# Patient Record
Sex: Female | Born: 1980 | Race: Black or African American | Hispanic: No | Marital: Single | Smoking: Never smoker
Health system: Southern US, Community
[De-identification: ages and names within clinical notes are randomized; demographics above are authoritative.]

## PROBLEM LIST (undated history)

## (undated) HISTORY — PX: TUBAL LIGATION: SHX77

---

## 2018-12-15 ENCOUNTER — Encounter (HOSPITAL_BASED_OUTPATIENT_CLINIC_OR_DEPARTMENT_OTHER): Payer: Self-pay | Admitting: *Deleted

## 2018-12-15 ENCOUNTER — Emergency Department (HOSPITAL_BASED_OUTPATIENT_CLINIC_OR_DEPARTMENT_OTHER): Payer: BC Managed Care – PPO

## 2018-12-15 ENCOUNTER — Emergency Department (HOSPITAL_BASED_OUTPATIENT_CLINIC_OR_DEPARTMENT_OTHER)
Admission: EM | Admit: 2018-12-15 | Discharge: 2018-12-15 | Disposition: A | Discharge status: Home / Self Care | Payer: BC Managed Care – PPO | Attending: Emergency Medicine | Admitting: Emergency Medicine

## 2018-12-15 ENCOUNTER — Other Ambulatory Visit: Payer: Self-pay

## 2018-12-15 DIAGNOSIS — R062 Wheezing: Secondary | ICD-10-CM | POA: Diagnosis not present

## 2018-12-15 DIAGNOSIS — R0602 Shortness of breath: Secondary | ICD-10-CM | POA: Diagnosis present

## 2018-12-15 DIAGNOSIS — R0600 Dyspnea, unspecified: Secondary | ICD-10-CM | POA: Diagnosis not present

## 2018-12-15 LAB — CBC WITH DIFFERENTIAL/PLATELET
Abs Immature Granulocytes: 0.02 10*3/uL (ref 0.00–0.07)
Basophils Absolute: 0 10*3/uL (ref 0.0–0.1)
Basophils Relative: 0 %
Eosinophils Absolute: 0 10*3/uL (ref 0.0–0.5)
Eosinophils Relative: 1 %
HCT: 44.1 % (ref 36.0–46.0)
Hemoglobin: 14.5 g/dL (ref 12.0–15.0)
Immature Granulocytes: 0 %
Lymphocytes Relative: 41 %
Lymphs Abs: 2 10*3/uL (ref 0.7–4.0)
MCH: 30 pg (ref 26.0–34.0)
MCHC: 32.9 g/dL (ref 30.0–36.0)
MCV: 91.1 fL (ref 80.0–100.0)
Monocytes Absolute: 0.3 10*3/uL (ref 0.1–1.0)
Monocytes Relative: 7 %
Neutro Abs: 2.5 10*3/uL (ref 1.7–7.7)
Neutrophils Relative %: 51 %
Platelets: 187 10*3/uL (ref 150–400)
RBC: 4.84 MIL/uL (ref 3.87–5.11)
RDW: 13 % (ref 11.5–15.5)
WBC: 4.8 10*3/uL (ref 4.0–10.5)
nRBC: 0 % (ref 0.0–0.2)

## 2018-12-15 LAB — BASIC METABOLIC PANEL
Anion gap: 7 (ref 5–15)
BUN: 14 mg/dL (ref 6–20)
CO2: 23 mmol/L (ref 22–32)
Calcium: 9.3 mg/dL (ref 8.9–10.3)
Chloride: 108 mmol/L (ref 98–111)
Creatinine, Ser: 0.83 mg/dL (ref 0.44–1.00)
GFR calc Af Amer: >60 mL/min (ref >60)
GFR calc non Af Amer: >60 mL/min (ref >60)
Glucose, Bld: 74 mg/dL (ref 70–99)
Potassium: 3.5 mmol/L (ref 3.5–5.1)
Sodium: 138 mmol/L (ref 135–145)

## 2018-12-15 MED ORDER — ALBUTEROL SULFATE HFA 108 (90 BASE) MCG/ACT IN AERS
2.0000 | INHALATION_SPRAY | Freq: Once | RESPIRATORY_TRACT | Status: AC
  Administered 2018-12-15: 2 via RESPIRATORY_TRACT
  Filled 2018-12-15: qty 6.7

## 2018-12-15 MED ORDER — IOHEXOL 300 MG/ML  SOLN
75.0000 mL | Freq: Once | INTRAMUSCULAR | Status: AC | PRN
  Administered 2018-12-15: 75 mL via INTRAVENOUS

## 2018-12-15 NOTE — ED Provider Notes (Signed)
[facility] EMERGENCY DEPARTMENT Provider Note   CSN: [id number] Arrival date & time: 12/15/18  1818     History   Chief Complaint Chief Complaint  Patient presents with  . Shortness of Breath    HPI Lisa Giles is a 38 y.o. female.     HPI   Pt is a 38 y/o female who presents to the ED today for eval of shortness of breath. States that for the last several years. States it seems like it has become worse over the last few months.  States that today Lisa Giles felt like Lisa Giles was hyperventilating when walking up the stairs at work.  Lisa Giles has sxs at rest and with exertion, but seem to be worse with exertion. Denies chest pain. States that it feels like Lisa Giles is "breathing through a broken straw".   Denies leg pain/swelling, hemoptysis, recent surgery/trauma, recent long travel, hormone use, personal hx of cancer, or hx of DVT/PE. Denies bloody stool or heavy vaginal bleeding.   Lisa Giles states Lisa Giles has a pcp and has f/u with them about it. Upon review of her chart it appears Lisa Giles was referred to pulmonology but Lisa Giles states that Lisa Giles did not follow-up with them. Lisa Giles states Lisa Giles has had outpatient PFTs and is not aware that there was any abnormal finding.  States that when Lisa Giles drinks tumeric tea her sxs improve. Lisa Giles reports feeling anxious when this happens.   Denies h/o tobacco or drug use. Denies early family hx of MI. Denies history of hypertension, hyperlipidemia, diabetes.  History reviewed. No pertinent past medical history.  There are no active problems to display for this patient.  Past Surgical History:  Procedure Laterality Date  . TUBAL LIGATION       OB History   No obstetric history on file.      Home Medications    Prior to Admission medications   Not on File    Family History History reviewed. No pertinent family history.  Social History Social History   Tobacco Use  . Smoking status: Never Smoker  . Smokeless tobacco: Never Used  Substance Use  Topics  . Alcohol use: Not Currently  . Drug use: Not Currently     Allergies   Patient has no known allergies.   Review of Systems Review of Systems  Constitutional: Negative for chills and fever.  HENT: Negative for ear pain and sore throat.        Neck swelling  Eyes: Negative for pain and visual disturbance.  Respiratory: Positive for shortness of breath. Negative for cough.   Cardiovascular: Negative for chest pain and leg swelling.  Gastrointestinal: Negative for abdominal pain, constipation, diarrhea, nausea and vomiting.  Genitourinary: Negative for dysuria and hematuria.  Musculoskeletal: Negative for back pain.  Skin: Negative for rash.  Neurological: Negative for headaches.  All other systems reviewed and are negative.    Physical Exam Updated Vital Signs BP 140/78 (BP Location: Left Arm)   Pulse 68   Temp 98.4 F (36.9 C) (Oral)   Resp 20   Ht 5\' 7"  (1.702 m)   Wt 81.6 kg   SpO2 97%   BMI 28.19 kg/m   Physical Exam Vitals signs and nursing note reviewed.  Constitutional:      General: Lisa Giles is not in acute distress.    Appearance: Lisa Giles is well-developed.  HENT:     Head: Normocephalic and atraumatic.  Eyes:     Conjunctiva/sclera: Conjunctivae normal.  Neck:     Musculoskeletal: Neck  supple.     Comments: Mild area of increased tissue density to the right side of the next which seems slightly lateral to the thyroid gland. This area is soft and nontender.  Cardiovascular:     Rate and Rhythm: Normal rate and regular rhythm.     Heart sounds: Normal heart sounds. No murmur.  Pulmonary:     Effort: Pulmonary effort is normal. No respiratory distress.     Breath sounds: Examination of the right-lower field reveals wheezing. Wheezing present. No decreased breath sounds, rhonchi or rales.  Abdominal:     General: Bowel sounds are normal.     Palpations: Abdomen is soft.     Tenderness: There is no abdominal tenderness.  Musculoskeletal:     Right  lower leg: Lisa Giles exhibits no tenderness. No edema.     Left lower leg: Lisa Giles exhibits no tenderness. No edema.  Skin:    General: Skin is warm and dry.  Neurological:     Mental Status: Lisa Giles is alert.      ED Treatments / Results  Labs (all labs ordered are listed, but only abnormal results are displayed) Labs Reviewed  CBC WITH DIFFERENTIAL/PLATELET  BASIC METABOLIC PANEL    EKG None  Radiology Ct Soft Tissue Neck W Contrast  Result Date: 12/15/2018 CLINICAL DATA:  Right neck swelling EXAM: CT NECK WITH CONTRAST TECHNIQUE: Multidetector CT imaging of the neck was performed using the standard protocol following the bolus administration of intravenous contrast. CONTRAST:  5mL OMNIPAQUE IOHEXOL 300 MG/ML  SOLN COMPARISON:  None. FINDINGS: Pharynx and larynx: Normal. No mass or swelling. Salivary glands: No inflammation, mass, or stone. Thyroid: Negative Lymph nodes: No enlarged lymph nodes in the neck. Vascular: Normal vascular enhancement. Limited intracranial: Negative Visualized orbits: Negative Mastoids and visualized paranasal sinuses: Paranasal sinuses clear. Mastoid sinus clear. Skeleton: Negative Upper chest: Negative Other: None IMPRESSION: Negative CT neck. Electronically Signed   By: Marlan Palau M.D.   On: 12/15/2018 20:34   Dg Chest Portable 1 View  Result Date: 12/15/2018 CLINICAL DATA:  SOB x 3 YEARS; worsening over the past few months; today felt like Lisa Giles was hyperventilating; EXAM: PORTABLE CHEST 1 VIEW COMPARISON:  06/19/2014 FINDINGS: Cardiac silhouette is normal in size. No mediastinal or hilar masses or evidence of adenopathy. Clear lungs.  No pleural effusion or pneumothorax. Skeletal structures are grossly intact. IMPRESSION: No active disease. Electronically Signed   By: Amie Portland M.D.   On: 12/15/2018 19:34    Procedures Procedures (including critical care time)  Medications Ordered in ED Medications  albuterol (VENTOLIN HFA) 108 (90 Base) MCG/ACT  inhaler 2 puff (2 puffs Inhalation Given 12/15/18 1933)  iohexol (OMNIPAQUE) 300 MG/ML solution 75 mL (75 mLs Intravenous Contrast Given 12/15/18 2009)     Initial Impression / Assessment and Plan / ED Course  I have reviewed the triage vital signs and the nursing notes.  Pertinent labs & imaging results that were available during my care of the patient were reviewed by me and considered in my medical decision making (see chart for details).   Final Clinical Impressions(s) / ED Diagnoses   Final diagnoses:  Dyspnea, unspecified type   38 year old female who presents the emergency department today complaining of shortness of breath that seems to be a chronic problem but Lisa Giles thinks is worsened over the last 2 months.  It is not associated with any other significant symptoms.  Lisa Giles is often so concerned because Lisa Giles feels like Lisa Giles has some swelling to  the right side of her neck which is also been chronic.  No difficulty swallowing.  Tolerating secretions.  Lisa Giles is mildly hypertensive but otherwise her vital signs are reassuring.  Lisa Giles has some very slight wheezing on her lung exam but otherwise no abnormalities.  Heart with regular rate and rhythm.  No peripheral edema, calf tenderness or erythema.  CBC without anemia.  BMP with normal electrolytes and kidney function.  Chest x-ray is negative.  CT soft tissue neck did not show any abnormalities with the thyroid or elsewhere in the neck.  Patient was given albuterol.  Lisa Giles states it made her feel jittery but did not change her symptoms although on reassessment Lisa Giles is now stating that Lisa Giles does not feel short of breath anymore.  Lisa Giles has continued to maintain sats at 97 and above during her stay.  Lisa Giles was ambulated about the ED and had sats at 98% on room air.  I offered Covid testing and Lisa Giles declined.  I do have lower suspicion for this given that this seems to be much more of a chronic problem.  Advised that I am unsure of the etiology of her symptoms  but I have low suspicion for emergent pathology at this time and feel that Lisa Giles can follow-up as an outpatient.  I will give referral to pulmonology.  Have also advised her to follow-up with PCP as Lisa Giles may need ultrasound of the thyroid.  Advised on specific return precautions.  Lisa Giles voiced understanding of the plan and reasons to return.  All questions answered.  Patient stable for discharge.  ED Discharge Orders    None       Rayne DuCouture, Alva Broxson S, PA-C 12/15/18 2314    Gwyneth SproutPlunkett, Whitney, MD 12/15/18 [phone]

## 2018-12-15 NOTE — ED Triage Notes (Signed)
Pt c/o SOB x 3 years increased SOB x 2 months , denies asthma

## 2018-12-15 NOTE — Progress Notes (Signed)
RN gave albuterol treatment °

## 2018-12-15 NOTE — ED Notes (Signed)
Pulse ox at 98% while ambulating through department. No SOB noted, mild exp wheezes

## 2018-12-15 NOTE — Discharge Instructions (Signed)
Please take two puffs of the albuterol inhaler every 4-6 hours as needed for shortness of breath.   You were given information of follow-up with a pulmonologist.  Please call the office to schedule an appointment.  The CT scan of your neck did not show any evidence of abnormality in your thyroid today however you may need to follow-up with your primary care doctor to get an ultrasound of your thyroid.  Please make an appointment to follow-up with them within the next 1 to 2 weeks.  Monitor your symptoms closely, if your symptoms worsen or change please return to the emergency department for reevaluation.

## 2019-06-23 ENCOUNTER — Encounter (HOSPITAL_COMMUNITY): Payer: Self-pay

## 2019-06-23 ENCOUNTER — Emergency Department (HOSPITAL_COMMUNITY): Payer: BC Managed Care – PPO

## 2019-06-23 ENCOUNTER — Other Ambulatory Visit: Payer: Self-pay

## 2019-06-23 ENCOUNTER — Emergency Department (HOSPITAL_COMMUNITY)
Admission: EM | Admit: 2019-06-23 | Discharge: 2019-06-23 | Disposition: A | Discharge status: Home / Self Care | Payer: BC Managed Care – PPO | Attending: Emergency Medicine | Admitting: Emergency Medicine

## 2019-06-23 DIAGNOSIS — R140 Abdominal distension (gaseous): Secondary | ICD-10-CM | POA: Diagnosis not present

## 2019-06-23 DIAGNOSIS — K5900 Constipation, unspecified: Secondary | ICD-10-CM | POA: Diagnosis not present

## 2019-06-23 DIAGNOSIS — R109 Unspecified abdominal pain: Secondary | ICD-10-CM | POA: Diagnosis present

## 2019-06-23 LAB — COMPREHENSIVE METABOLIC PANEL
ALT: 16 U/L (ref 0–44)
AST: 20 U/L (ref 15–41)
Albumin: 4.2 g/dL (ref 3.5–5.0)
Alkaline Phosphatase: 51 U/L (ref 38–126)
Anion gap: 6 (ref 5–15)
BUN: 8 mg/dL (ref 6–20)
CO2: 25 mmol/L (ref 22–32)
Calcium: 8.8 mg/dL — ABNORMAL LOW (ref 8.9–10.3)
Chloride: 108 mmol/L (ref 98–111)
Creatinine, Ser: 0.81 mg/dL (ref 0.44–1.00)
GFR calc Af Amer: >60 mL/min (ref >60)
GFR calc non Af Amer: >60 mL/min (ref >60)
Glucose, Bld: 84 mg/dL (ref 70–99)
Potassium: 4.5 mmol/L (ref 3.5–5.1)
Sodium: 139 mmol/L (ref 135–145)
Total Bilirubin: 0.9 mg/dL (ref 0.3–1.2)
Total Protein: 7.2 g/dL (ref 6.5–8.1)

## 2019-06-23 LAB — CBC
HCT: 41.3 % (ref 36.0–46.0)
Hemoglobin: 13.4 g/dL (ref 12.0–15.0)
MCH: 29.8 pg (ref 26.0–34.0)
MCHC: 32.4 g/dL (ref 30.0–36.0)
MCV: 92 fL (ref 80.0–100.0)
Platelets: 197 10*3/uL (ref 150–400)
RBC: 4.49 MIL/uL (ref 3.87–5.11)
RDW: 12.9 % (ref 11.5–15.5)
WBC: 5.1 10*3/uL (ref 4.0–10.5)
nRBC: 0 % (ref 0.0–0.2)

## 2019-06-23 LAB — URINALYSIS, ROUTINE W REFLEX MICROSCOPIC
Bilirubin Urine: NEGATIVE
Glucose, UA: NEGATIVE mg/dL
Hgb urine dipstick: NEGATIVE
Ketones, ur: NEGATIVE mg/dL
Leukocytes,Ua: NEGATIVE
Nitrite: NEGATIVE
Protein, ur: NEGATIVE mg/dL
Specific Gravity, Urine: 1.016 (ref 1.005–1.030)
pH: 8 (ref 5.0–8.0)

## 2019-06-23 LAB — I-STAT BETA HCG BLOOD, ED (MC, WL, AP ONLY): I-stat hCG, quantitative: <5 m[IU]/mL (ref <5)

## 2019-06-23 LAB — LIPASE, BLOOD: Lipase: 23 U/L (ref 11–51)

## 2019-06-23 MED ORDER — PSYLLIUM 58.6 % PO PACK: 1.0000 | PACK | Freq: Two times a day (BID) | ORAL | 0 refills | Status: AC

## 2019-06-23 MED ORDER — SODIUM CHLORIDE (PF) 0.9 % IJ SOLN
INTRAMUSCULAR | Status: AC
  Filled 2019-06-23: qty 50

## 2019-06-23 MED ORDER — DOCUSATE SODIUM 100 MG PO CAPS: 100.0000 mg | ORAL_CAPSULE | Freq: Two times a day (BID) | ORAL | 0 refills | Status: AC

## 2019-06-23 MED ORDER — SODIUM CHLORIDE 0.9% FLUSH
3.0000 mL | Freq: Once | INTRAVENOUS | Status: AC
  Administered 2019-06-23: 3 mL via INTRAVENOUS

## 2019-06-23 MED ORDER — IOHEXOL 300 MG/ML  SOLN
100.0000 mL | Freq: Once | INTRAMUSCULAR | Status: AC | PRN
  Administered 2019-06-23: 100 mL via INTRAVENOUS

## 2019-06-23 NOTE — ED Triage Notes (Signed)
Pt presents with c/o left lower abdominal pain. Pt was seen at her PCP yesterday and told to increase her fiber intake. Pt reports that she believes she is constipated and has not had a bowel movement without the use of an enema in 2 weeks. Pt reports a feeling of fullness each time she eats.

## 2019-06-23 NOTE — ED Provider Notes (Signed)
[facility]-EMERGENCY DEPT Provider Note   CSN: [id number] Arrival date & time: 06/23/19  1110     History Chief Complaint  Patient presents with  . Abdominal Pain    Lisa Giles is a 39 y.o. female.  Presents today for evaluation of acute onset, persistent constipation for 2 weeks.  She reports she has a history of chronic constipation but has not been able to have a bowel movement without administering an enema over the last 2 weeks.  She does report improvement in her symptoms after administration of enema.  Reports early satiety and feeling bloated to her abdomen.  She also feels a pulling sensation with meals and certain movements as well.  Denies urinary symptoms, fevers, chills.  She has also been taking magnesium citrate, MiraLAX, and mineral oil with little relief.  She went to her PCP yesterday who recommended increasing fiber intake.  Reports intermittent lower abdominal pains which she describes mostly as a pressure.  No vomiting.  Reports she is never had symptoms like this before.  Prior to this she reports she would have a bowel movement approximately 3 times a week may be every 3 to 4 days.  The history is provided by the patient.       History reviewed. No pertinent past medical history.  There are no problems to display for this patient.   Past Surgical History:  Procedure Laterality Date  . TUBAL LIGATION       OB History   No obstetric history on file.     History reviewed. No pertinent family history.  Social History   Tobacco Use  . Smoking status: Never Smoker  . Smokeless tobacco: Never Used  Substance Use Topics  . Alcohol use: Not Currently  . Drug use: Not Currently    Home Medications Prior to Admission medications   Medication Sig Start Date End Date Taking? Authorizing Provider  polyethylene glycol (MIRALAX / GLYCOLAX) 17 g packet Take 17 g by mouth once.   Yes [provider]  docusate sodium (COLACE)  100 MG capsule Take 1 capsule (100 mg total) by mouth every 12 (twelve) hours. 06/23/19   Millee Denise A, PA-C  psyllium (METAMUCIL) 58.6 % packet Take 1 packet by mouth 2 (two) times daily. 06/23/19   Michela Pitcher A, PA-C    Allergies    Patient has no known allergies.  Review of Systems   Review of Systems  Constitutional: Negative for chills and fever.  Respiratory: Negative for shortness of breath.   Cardiovascular: Negative for chest pain.  Gastrointestinal: Positive for abdominal pain and constipation. Negative for diarrhea and vomiting.  Genitourinary: Negative for dysuria, frequency, hematuria and urgency.  All other systems reviewed and are negative.   Physical Exam Updated Vital Signs BP 131/88 (BP Location: Right Arm)   Pulse 75   Temp 98.8 F (37.1 C) (Oral)   Resp 16   LMP 05/02/2019 (Approximate)   SpO2 100%   Physical Exam Vitals and nursing note reviewed.  Constitutional:      General: She is not in acute distress.    Appearance: She is well-developed.  HENT:     Head: Normocephalic and atraumatic.  Eyes:     General:        Right eye: No discharge.        Left eye: No discharge.     Conjunctiva/sclera: Conjunctivae normal.  Neck:     Vascular: No JVD.     Trachea: No tracheal  deviation.  Cardiovascular:     Rate and Rhythm: Normal rate and regular rhythm.  Pulmonary:     Effort: Pulmonary effort is normal.     Breath sounds: Normal breath sounds.  Abdominal:     General: Bowel sounds are normal. There is no distension.     Palpations: Abdomen is soft.     Tenderness: There is abdominal tenderness in the left upper quadrant and left lower quadrant. There is no right CVA tenderness, left CVA tenderness, guarding or rebound.  Skin:    General: Skin is warm and dry.     Findings: No erythema.  Neurological:     Mental Status: She is alert.  Psychiatric:        Behavior: Behavior normal.     ED Results / Procedures / Treatments   Labs (all labs  ordered are listed, but only abnormal results are displayed) Labs Reviewed  COMPREHENSIVE METABOLIC PANEL - Abnormal; Notable for the following components:      Result Value   Calcium 8.8 (*)    All other components within normal limits  LIPASE, BLOOD  CBC  URINALYSIS, ROUTINE W REFLEX MICROSCOPIC  I-STAT BETA HCG BLOOD, ED ([facility], [facility], [facility] ONLY)    EKG None  Radiology CT ABDOMEN PELVIS W CONTRAST  Result Date: 06/23/2019 CLINICAL DATA:  Pt presents with c/o left lower abdominal pain. Pt was seen at her PCP yesterday and told to increase her fiber intake. Pt reports that she believes she is constipated and has not had a bowel movement without the use of an enema Diverticulitis suspected EXAM: CT ABDOMEN AND PELVIS WITH CONTRAST TECHNIQUE: Multidetector CT imaging of the abdomen and pelvis was performed using the standard protocol following bolus administration of intravenous contrast. CONTRAST:  OMNIPAQUE IOHEXOL 300 MG/ML  SOLN COMPARISON:  None FINDINGS: Lower chest: Lung bases are clear. Hepatobiliary: No focal hepatic lesion. No biliary duct dilatation. Gallbladder is normal. Common bile duct is normal. Pancreas: Pancreas is normal. No ductal dilatation. No pancreatic inflammation. Spleen: Normal spleen Adrenals/urinary tract: Adrenal glands and kidneys are normal. The ureters and bladder normal. Stomach/Bowel: Stomach, small bowel, appendix, and cecum are normal. Ascending, transverse and descending colon normal. Mild to moderate volume stool in the RIGHT colon. The LEFT colon is collapsed. Rectosigmoid colon is collapsed. No bowel inflammation or obstruction. Vascular/Lymphatic: Abdominal aorta is normal caliber. No periportal or retroperitoneal adenopathy. No pelvic adenopathy. Reproductive: Uterus and ovaries are normal. Enhancing follicle within the LEFT ovary. Small amount free fluid the pelvis is favored physiologic. Other: No pelvic infection, abscess or inflammation. Musculoskeletal:  No aggressive osseous lesion. IMPRESSION: 1. No bowel obstruction or inflammation. Mild to moderate volume stool in the RIGHT colon is favored within normal limits. LEFT colon collapsed. 2. Small amount free fluid the pelvis is favored physiologic. Electronically Signed   By: Genevive Bi M.D.   On: 06/23/2019 14:10    Procedures Procedures (including critical care time)  Medications Ordered in ED Medications  sodium chloride (PF) 0.9 % injection (has no administration in time range)  sodium chloride flush (NS) 0.9 % injection 3 mL (3 mLs Intravenous Given 06/23/19 1348)  iohexol (OMNIPAQUE) 300 MG/ML solution 100 mL (100 mLs Intravenous Contrast Given 06/23/19 1355)    ED Course  I have reviewed the triage vital signs and the nursing notes.  Pertinent labs & imaging results that were available during my care of the patient were reviewed by me and considered in my medical decision making (see  chart for details).    MDM Rules/Calculators/A&P                      Patient presenting for evaluation of constipation, abdominal pain.  She is afebrile, vital signs are stable.  She is nontoxic in appearance.  No rebound or guarding noted on examination of the abdomen.  She has longstanding issues with constipation per her report but has never been seen by gastroenterology.  Clinically she is well-appearing and resting comfortably on initial assessment.  Lab work reviewed and interpreted by myself shows no leukocytosis, no anemia, no metabolic derangements, no renal insufficiency.  UA does not suggest UTI or nephrolithiasis.  CT scan shows no evidence of bowel obstruction or inflammation.  It does show mild to moderate volume stool in the right colon with left colon collapse consistent with her history of recent enemas.  No evidence of acute surgical abdominal pathology today.  On reevaluation patient is resting comfortably in no apparent distress.  Serial abdominal examinations remain benign.  She  has been tolerating p.o. food and fluids at home.  Recommend close follow-up with GI outpatient.  Will discharge with Metamucil, stool softener, discussed high-fiber diet.  Discussed strict ED return precautions. Patient verbalized understanding of and agreement with plan and is safe for discharge home at this time.    Final Clinical Impression(s) / ED Diagnoses Final diagnoses:  Left sided abdominal pain  Constipation, unspecified constipation type    Rx / DC Orders ED Discharge Orders         Ordered    psyllium (METAMUCIL) 58.6 % packet  2 times daily     06/23/19 1523    docusate sodium (COLACE) 100 MG capsule  Every 12 hours     06/23/19 1523           Renita Papa, PA-C 06/23/19 [place], Ankit, MD 06/23/19 1603

## 2019-06-23 NOTE — Discharge Instructions (Signed)
Your work-up today was reassuring with no evidence of severe infection or surgical issues.  Lab work is reassuring as well.  I would recommend that you stop using enemas at this time.  I would recommend that you start taking psyllium with water daily as well as stool softener (docusate sodium).  I have attached information regarding a high-fiber diet.  Please follow-up with gastroenterology for reevaluation of your longstanding constipation issues.  Return to the emergency department if any concerning signs or symptoms develop such as fevers, severe pain, vomiting, blood in the stool or urine.

## 2020-01-01 ENCOUNTER — Emergency Department (HOSPITAL_COMMUNITY): Payer: BC Managed Care – PPO

## 2020-01-01 ENCOUNTER — Encounter (HOSPITAL_COMMUNITY): Payer: Self-pay | Admitting: Emergency Medicine

## 2020-01-01 ENCOUNTER — Emergency Department (HOSPITAL_COMMUNITY)
Admission: EM | Admit: 2020-01-01 | Discharge: 2020-01-01 | Disposition: A | Discharge status: Home / Self Care | Payer: BC Managed Care – PPO | Attending: Emergency Medicine | Admitting: Emergency Medicine

## 2020-01-01 DIAGNOSIS — R061 Stridor: Secondary | ICD-10-CM | POA: Insufficient documentation

## 2020-01-01 DIAGNOSIS — R062 Wheezing: Secondary | ICD-10-CM | POA: Diagnosis present

## 2020-01-01 DIAGNOSIS — R0600 Dyspnea, unspecified: Secondary | ICD-10-CM | POA: Diagnosis not present

## 2020-01-01 LAB — COMPREHENSIVE METABOLIC PANEL
ALT: 17 U/L (ref 0–44)
AST: 18 U/L (ref 15–41)
Albumin: 4.1 g/dL (ref 3.5–5.0)
Alkaline Phosphatase: 44 U/L (ref 38–126)
Anion gap: 10 (ref 5–15)
BUN: 10 mg/dL (ref 6–20)
CO2: 23 mmol/L (ref 22–32)
Calcium: 8.9 mg/dL (ref 8.9–10.3)
Chloride: 107 mmol/L (ref 98–111)
Creatinine, Ser: 0.72 mg/dL (ref 0.44–1.00)
GFR, Estimated: >60 mL/min (ref >60)
Glucose, Bld: 81 mg/dL (ref 70–99)
Potassium: 3.6 mmol/L (ref 3.5–5.1)
Sodium: 140 mmol/L (ref 135–145)
Total Bilirubin: 0.2 mg/dL — ABNORMAL LOW (ref 0.3–1.2)
Total Protein: 7.2 g/dL (ref 6.5–8.1)

## 2020-01-01 LAB — CBC
HCT: 40.9 % (ref 36.0–46.0)
Hemoglobin: 13 g/dL (ref 12.0–15.0)
MCH: 29.5 pg (ref 26.0–34.0)
MCHC: 31.8 g/dL (ref 30.0–36.0)
MCV: 92.7 fL (ref 80.0–100.0)
Platelets: 202 10*3/uL (ref 150–400)
RBC: 4.41 MIL/uL (ref 3.87–5.11)
RDW: 13.2 % (ref 11.5–15.5)
WBC: 4.6 10*3/uL (ref 4.0–10.5)
nRBC: 0 % (ref 0.0–0.2)

## 2020-01-01 LAB — LIPASE, BLOOD: Lipase: 29 U/L (ref 11–51)

## 2020-01-01 NOTE — ED Provider Notes (Signed)
[facility]-EMERGENCY DEPT Provider Note   CSN: [id number] Arrival date & time: 01/01/20  1510     History Chief Complaint  Patient presents with  . Shortness of Breath  . Wheezing    Lisa Giles is a 39 y.o. female.  Patient has had changes to her respiratory status for over 4 years, has seen ENT multiple times, has had nasopharyngeal scope to evaluate and been told her airway is tight.  They want to try to medication for her to help, it did not help but did cause her side effects she cannot recall this medication.  I also told her she might need surgery 1 day.  Her condition today is not at its most severe, several months ago when she saw ENT it was more severe.  She is here today because her PCP told her to come she was having possible panic attack.  She was having tingling in her extremities quick labored breathing, feeling flushed near syncopal.  No sick contacts no trauma no changes to her health otherwise.   Wheezing Associated symptoms: stridor   Associated symptoms: no chest pain, no cough, no fever, no headaches, no rash, no rhinorrhea and no shortness of breath        History reviewed. No pertinent past medical history.  There are no problems to display for this patient.   Past Surgical History:  Procedure Laterality Date  . TUBAL LIGATION    . TUBAL LIGATION       OB History   No obstetric history on file.     No family history on file.  Social History   Tobacco Use  . Smoking status: Never Smoker  . Smokeless tobacco: Never Used  Substance Use Topics  . Alcohol use: Not Currently  . Drug use: Not Currently    Home Medications Prior to Admission medications   Medication Sig Start Date End Date Taking? Authorizing Provider  Ascorbic Acid (VITAMIN C) 1000 MG tablet Take 1,000 mg by mouth daily.   Yes [provider]  Ginger, Zingiber officinalis, (GINGER PO) Take 1 tablet by mouth daily.   Yes [provider]  psyllium (METAMUCIL) 58.6 % packet Take 1 packet by mouth 2 (two) times daily. 06/23/19  Yes Fawze, Mina A, PA-C  TURMERIC PO Take 1 tablet by mouth daily.   Yes [provider]  docusate sodium (COLACE) 100 MG capsule Take 1 capsule (100 mg total) by mouth every 12 (twelve) hours. Patient not taking: Reported on 01/01/2020 06/23/19   Michela Pitcher A, PA-C    Allergies    Linaclotide  Review of Systems   Review of Systems  Constitutional: Negative for chills and fever.  HENT: Negative for congestion and rhinorrhea.   Respiratory: Positive for wheezing and stridor. Negative for cough and shortness of breath.   Cardiovascular: Negative for chest pain and palpitations.  Gastrointestinal: Positive for constipation. Negative for diarrhea, nausea and vomiting.  Genitourinary: Negative for difficulty urinating and dysuria.  Musculoskeletal: Negative for arthralgias and back pain.  Skin: Negative for rash and wound.  Neurological: Negative for light-headedness and headaches.    Physical Exam Updated Vital Signs BP 119/80   Pulse (!) 58   Temp 98.3 F (36.8 C) (Oral)   Resp 16   LMP 12/29/2019 (Exact Date)   SpO2 100%   Physical Exam Vitals and nursing note reviewed. Exam conducted with a chaperone present.  Constitutional:      General: She is not in acute  distress.    Appearance: Normal appearance.  HENT:     Head: Normocephalic and atraumatic.     Nose: No rhinorrhea.     Mouth/Throat:     Mouth: Mucous membranes are moist.     Pharynx: Oropharynx is clear. No pharyngeal swelling or oropharyngeal exudate.  Eyes:     General:        Right eye: No discharge.        Left eye: No discharge.     Conjunctiva/sclera: Conjunctivae normal.  Neck:     Thyroid: No thyromegaly.  Cardiovascular:     Rate and Rhythm: Normal rate and regular rhythm.  Pulmonary:     Effort: Pulmonary effort is normal. No respiratory distress.     Breath sounds: No stridor. No decreased breath  sounds or wheezing.     Comments: Inspiratory and expiratory noises audibly heard but no distress no retractions no tachypnea no dyspnea Abdominal:     General: Abdomen is flat. There is no distension.     Palpations: Abdomen is soft.  Musculoskeletal:        General: No tenderness or signs of injury.  Skin:    General: Skin is warm and dry.  Neurological:     General: No focal deficit present.     Mental Status: She is alert. Mental status is at baseline.     Motor: No weakness.  Psychiatric:        Mood and Affect: Mood normal.        Behavior: Behavior normal.     ED Results / Procedures / Treatments   Labs (all labs ordered are listed, but only abnormal results are displayed) Labs Reviewed  COMPREHENSIVE METABOLIC PANEL - Abnormal; Notable for the following components:      Result Value   Total Bilirubin 0.2 (*)    All other components within normal limits  CBC  LIPASE, BLOOD    EKG EKG Interpretation  Date/Time:  Tuesday January 01 2020 15:28:15 EST Ventricular Rate:  61 PR Interval:    QRS Duration: 91 QT Interval:  402 QTC Calculation: 405 R Axis:   29 Text Interpretation: Sinus rhythm Abnormal R-wave progression, early transition 12 Lead; Mason-Likar Confirmed by Cherlynn Perches ([id number]) on 01/01/2020 3:48:58 PM   Radiology DG Chest 2 View  Result Date: 01/01/2020 CLINICAL DATA:  Shortness of breath. EXAM: CHEST - 2 VIEW COMPARISON:  12/15/2018 FINDINGS: Both lungs are clear. Stable appearance of the heart and mediastinum. Negative for a pneumothorax. No pleural effusions. Bone structures are unremarkable. IMPRESSION: No active cardiopulmonary disease. Electronically Signed   By: Richarda Overlie M.D.   On: 01/01/2020 17:10   DG Abdomen 1 View  Result Date: 01/01/2020 CLINICAL DATA:  Severe constipation. EXAM: ABDOMEN - 1 VIEW COMPARISON:  CT abdomen pelvis 06/23/2019 FINDINGS: Supine views of the abdomen or pelvis were obtained. Moderate amount of stool throughout the  abdomen. Nonobstructive bowel gas pattern. No large abdominal calcifications. Small pelvic calcifications are suggestive for phleboliths. Bone structures are unremarkable. IMPRESSION: Moderate stool burden.  Nonobstructive bowel gas pattern. Electronically Signed   By: Richarda Overlie M.D.   On: 01/01/2020 17:13    Procedures Procedures (including critical care time)  Medications Ordered in ED Medications - No data to display  ED Course  I have reviewed the triage vital signs and the nursing notes.  Pertinent labs & imaging results that were available during my care of the patient were reviewed by me and considered in my medical  decision making (see chart for details).    MDM Rules/Calculators/A&P                          Chronic changes to her voice and respiratory status need evaluation by ENT.  As for the near syncope panic attack sensation EKG is done that shows no acute ischemic change interval abnormality or arrhythmia.  Patient is PERC negative.  We will get screening labs of CBC CMP lipase will get chest x-ray and will get abdominal x-ray she has chronic constipation.  X-ray imaging reviewed by radiology myself shows no acute cardiopulmonary path allergy, abdomen shows normal bowel gas pattern with moderate stool burden.  Consistent with patient's history constipation.  Laboratory studies unremarkable for any endorgan dysfunction or significant electrolyte derangements.  She needs outpatient ENT follow-up return precautions provided  Final Clinical Impression(s) / ED Diagnoses Final diagnoses:  Dyspnea, unspecified type    Rx / DC Orders ED Discharge Orders    None       Sabino Donovan, MD 01/01/20 1844

## 2020-01-01 NOTE — ED Triage Notes (Signed)
Patient was sent by PCP for evaluation of wheezing and SOB, has had this for years. Felt pins and needles last night. Has been told she has a small airway, has F/U w/ ENT and specialists with no answers.

## 2020-01-01 NOTE — Discharge Instructions (Signed)
ENT and audiology on [facility], addresses [address], [address], phone number is [phone]

## 2022-06-03 IMAGING — CR DG CHEST 2V
2 series · 2 of 2 positions shown · non-contrast
Comparison: 12/15/2018

CLINICAL DATA: Shortness of breath.

EXAM:
CHEST - 2 VIEW

[w chest pa]
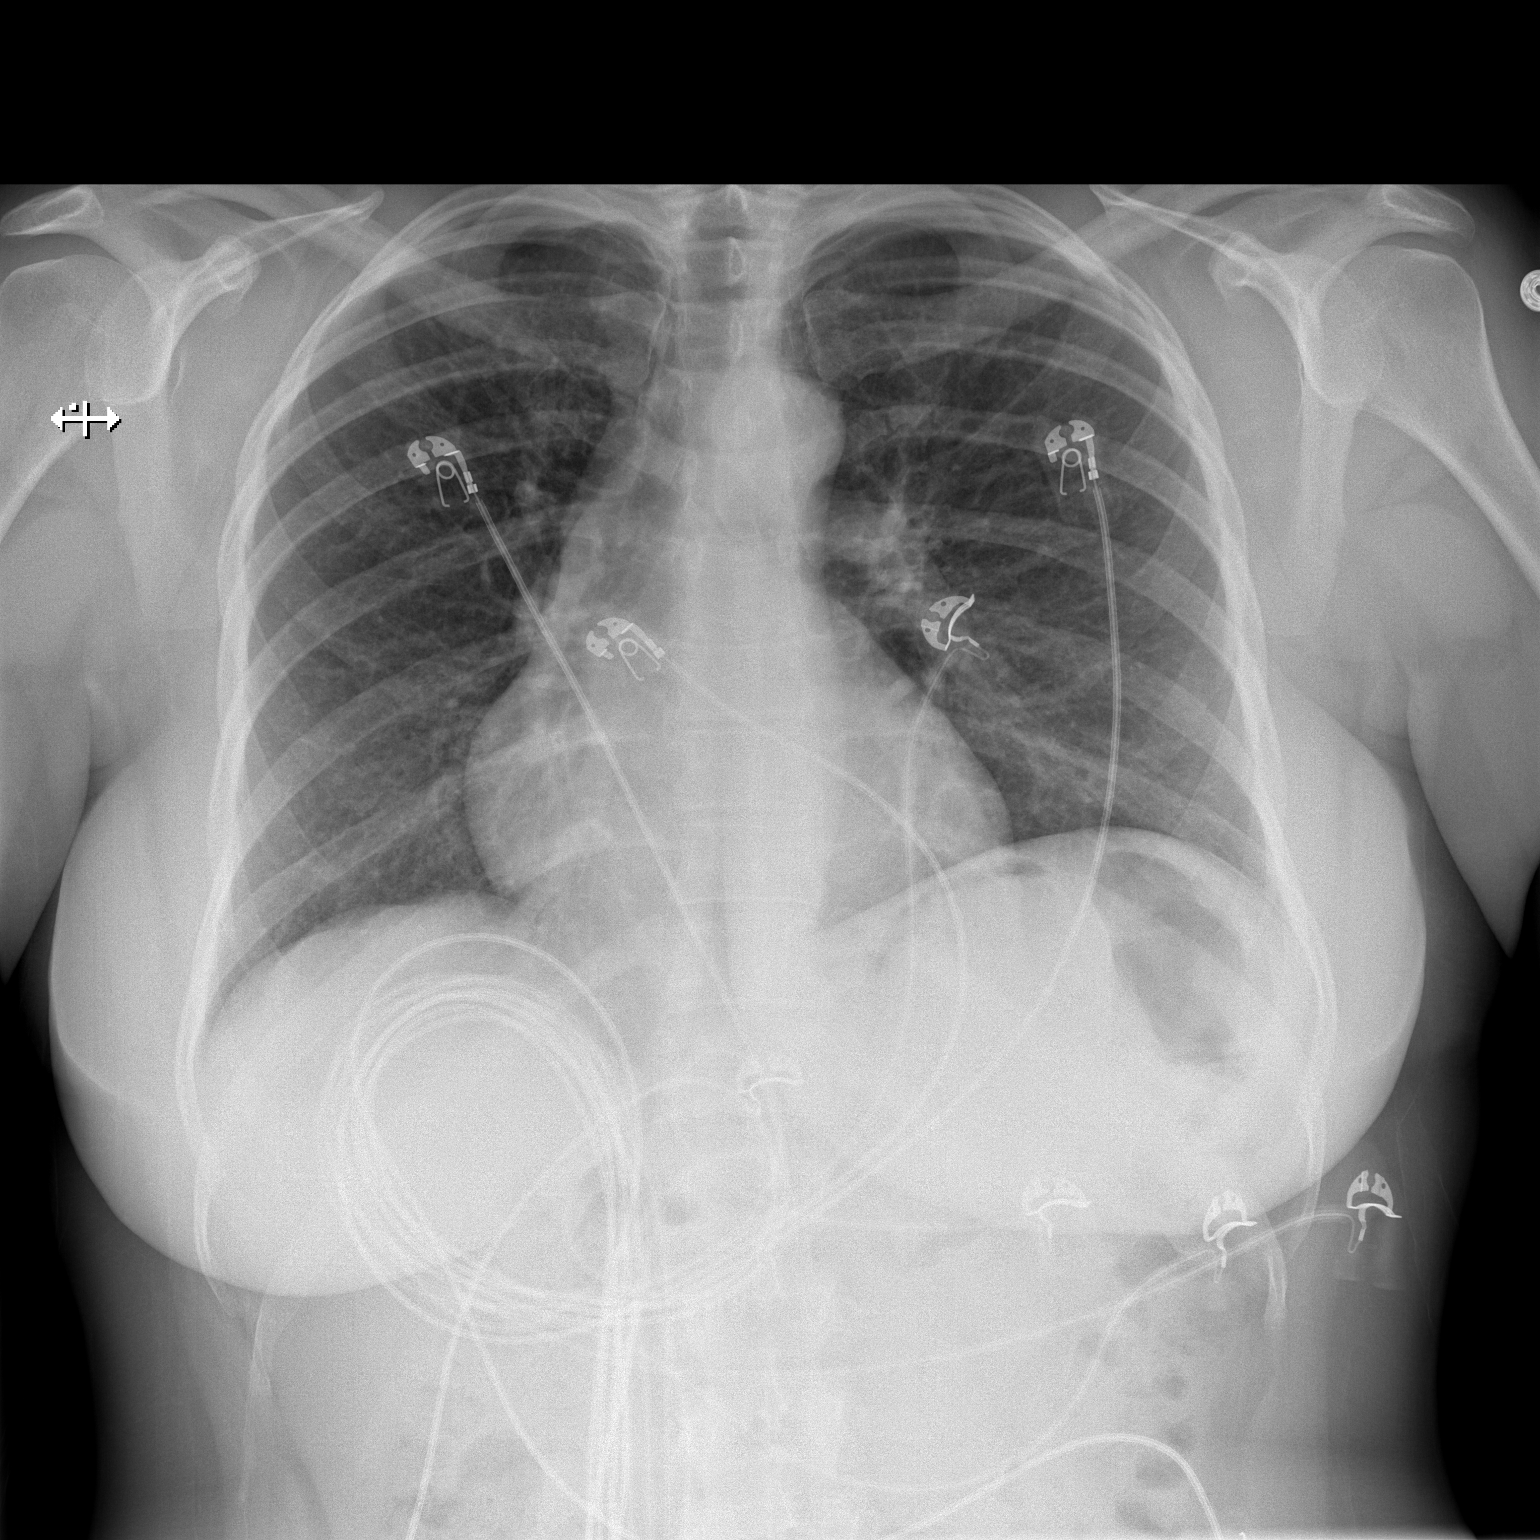

[w chest lat]
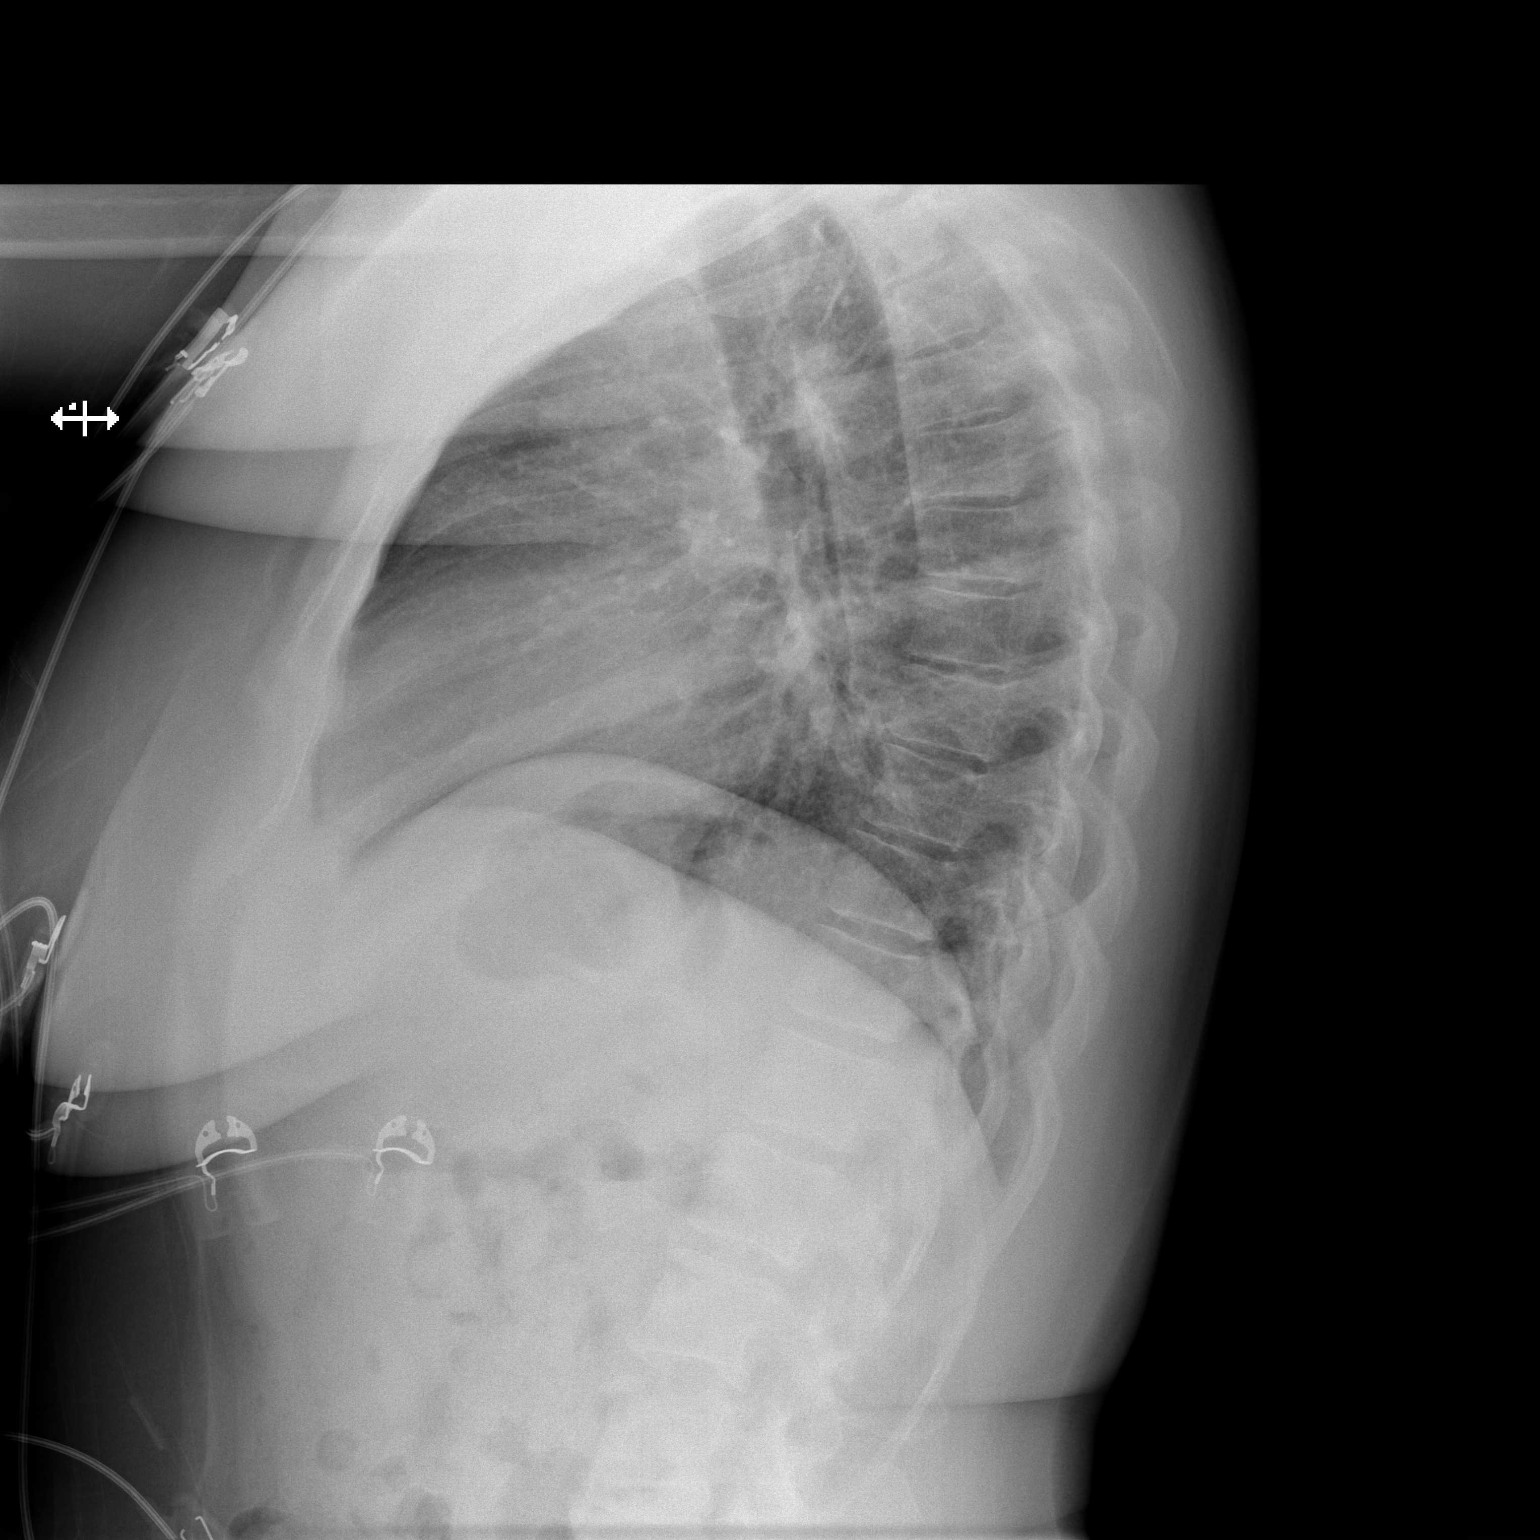

[2 of 2 positions shown; findings below may reference images not displayed]

FINDINGS: Both lungs are clear. Stable appearance of the heart and
mediastinum. Negative for a pneumothorax. No pleural effusions. Bone
structures are unremarkable.
IMPRESSION: No active cardiopulmonary disease.
